# Patient Record
Sex: Male | Born: 1950 | Race: Black or African American | Hispanic: No | Marital: Married | Smoking: Never smoker
Health system: Southern US, Community
[De-identification: ages and names within clinical notes are randomized; demographics above are authoritative.]

## PROBLEM LIST (undated history)

## (undated) DIAGNOSIS — H04129 Dry eye syndrome of unspecified lacrimal gland: Secondary | ICD-10-CM

## (undated) DIAGNOSIS — D126 Benign neoplasm of colon, unspecified: Secondary | ICD-10-CM

## (undated) DIAGNOSIS — D709 Neutropenia, unspecified: Secondary | ICD-10-CM

## (undated) DIAGNOSIS — I10 Essential (primary) hypertension: Secondary | ICD-10-CM

## (undated) DIAGNOSIS — M479 Spondylosis, unspecified: Secondary | ICD-10-CM

## (undated) DIAGNOSIS — D72819 Decreased white blood cell count, unspecified: Secondary | ICD-10-CM

## (undated) DIAGNOSIS — A048 Other specified bacterial intestinal infections: Secondary | ICD-10-CM

## (undated) HISTORY — DX: Neutropenia, unspecified: D70.9

## (undated) HISTORY — DX: Spondylosis, unspecified: M47.9

## (undated) HISTORY — DX: Decreased white blood cell count, unspecified: D72.819

## (undated) HISTORY — PX: ANTERIOR CERVICAL DECOMP/DISCECTOMY FUSION: SHX1161

## (undated) HISTORY — PX: TONSILLECTOMY: SUR1361

## (undated) HISTORY — DX: Other specified bacterial intestinal infections: A04.8

## (undated) HISTORY — DX: Dry eye syndrome of unspecified lacrimal gland: H04.129

## (undated) HISTORY — DX: Essential (primary) hypertension: I10

## (undated) HISTORY — DX: Benign neoplasm of colon, unspecified: D12.6

---

## 1998-09-19 ENCOUNTER — Encounter: Payer: Self-pay | Admitting: Cardiovascular Disease

## 1998-09-19 ENCOUNTER — Ambulatory Visit (HOSPITAL_COMMUNITY): Admission: RE | Admit: 1998-09-19 | Discharge: 1998-09-19 | Payer: Self-pay | Admitting: Cardiovascular Disease

## 1998-11-11 ENCOUNTER — Emergency Department (HOSPITAL_COMMUNITY): Admission: EM | Admit: 1998-11-11 | Discharge: 1998-11-11 | Payer: Self-pay | Admitting: Emergency Medicine

## 1998-11-28 ENCOUNTER — Ambulatory Visit (HOSPITAL_COMMUNITY): Admission: RE | Admit: 1998-11-28 | Discharge: 1998-11-28 | Payer: Self-pay | Admitting: *Deleted

## 1999-01-13 ENCOUNTER — Emergency Department (HOSPITAL_COMMUNITY): Admission: EM | Admit: 1999-01-13 | Discharge: 1999-01-13 | Payer: Self-pay | Admitting: Emergency Medicine

## 1999-06-12 ENCOUNTER — Encounter: Payer: Self-pay | Admitting: Family Medicine

## 1999-06-12 ENCOUNTER — Ambulatory Visit (HOSPITAL_COMMUNITY): Admission: RE | Admit: 1999-06-12 | Discharge: 1999-06-12 | Payer: Self-pay | Admitting: Family Medicine

## 2001-12-28 ENCOUNTER — Encounter: Payer: Self-pay | Admitting: Family Medicine

## 2001-12-28 ENCOUNTER — Ambulatory Visit (HOSPITAL_COMMUNITY): Admission: RE | Admit: 2001-12-28 | Discharge: 2001-12-28 | Payer: Self-pay | Admitting: Family Medicine

## 2002-04-06 ENCOUNTER — Encounter: Payer: Self-pay | Admitting: Family Medicine

## 2002-04-06 ENCOUNTER — Ambulatory Visit (HOSPITAL_COMMUNITY): Admission: RE | Admit: 2002-04-06 | Discharge: 2002-04-06 | Payer: Self-pay | Admitting: Family Medicine

## 2002-08-31 ENCOUNTER — Encounter: Payer: Self-pay | Admitting: Emergency Medicine

## 2002-08-31 ENCOUNTER — Emergency Department (HOSPITAL_COMMUNITY): Admission: EM | Admit: 2002-08-31 | Discharge: 2002-08-31 | Payer: Self-pay | Admitting: Emergency Medicine

## 2002-12-16 ENCOUNTER — Ambulatory Visit (HOSPITAL_COMMUNITY): Admission: RE | Admit: 2002-12-16 | Discharge: 2002-12-16 | Payer: Self-pay | Admitting: Neurosurgery

## 2002-12-16 ENCOUNTER — Encounter: Payer: Self-pay | Admitting: Neurosurgery

## 2003-05-05 ENCOUNTER — Encounter: Admission: RE | Admit: 2003-05-05 | Discharge: 2003-05-05 | Payer: Self-pay | Admitting: Neurology

## 2003-05-05 ENCOUNTER — Encounter: Payer: Self-pay | Admitting: Radiology

## 2003-05-05 ENCOUNTER — Encounter: Payer: Self-pay | Admitting: Neurology

## 2003-07-18 ENCOUNTER — Encounter: Admission: RE | Admit: 2003-07-18 | Discharge: 2003-07-18 | Payer: Self-pay | Admitting: Neurology

## 2003-12-13 ENCOUNTER — Encounter: Admission: RE | Admit: 2003-12-13 | Discharge: 2003-12-13 | Payer: Self-pay | Admitting: Neurology

## 2004-09-29 ENCOUNTER — Encounter: Admission: RE | Admit: 2004-09-29 | Discharge: 2004-09-29 | Payer: Self-pay | Admitting: Neurosurgery

## 2004-12-10 ENCOUNTER — Ambulatory Visit (HOSPITAL_COMMUNITY): Admission: RE | Admit: 2004-12-10 | Discharge: 2004-12-11 | Payer: Self-pay | Admitting: Neurosurgery

## 2005-01-09 ENCOUNTER — Encounter: Admission: RE | Admit: 2005-01-09 | Discharge: 2005-01-09 | Payer: Self-pay | Admitting: Neurosurgery

## 2005-01-14 ENCOUNTER — Encounter: Admission: RE | Admit: 2005-01-14 | Discharge: 2005-01-14 | Payer: Self-pay | Admitting: Neurosurgery

## 2005-02-17 ENCOUNTER — Encounter: Admission: RE | Admit: 2005-02-17 | Discharge: 2005-02-17 | Payer: Self-pay | Admitting: Neurosurgery

## 2005-03-26 ENCOUNTER — Emergency Department (HOSPITAL_COMMUNITY): Admission: EM | Admit: 2005-03-26 | Discharge: 2005-03-26 | Payer: Self-pay | Admitting: Emergency Medicine

## 2005-03-29 ENCOUNTER — Emergency Department (HOSPITAL_COMMUNITY): Admission: EM | Admit: 2005-03-29 | Discharge: 2005-03-29 | Payer: Self-pay | Admitting: Family Medicine

## 2005-07-13 ENCOUNTER — Encounter: Admission: RE | Admit: 2005-07-13 | Discharge: 2005-07-13 | Payer: Self-pay | Admitting: Neurosurgery

## 2005-07-23 ENCOUNTER — Encounter: Admission: RE | Admit: 2005-07-23 | Discharge: 2005-07-23 | Payer: Self-pay | Admitting: Neurosurgery

## 2006-05-01 IMAGING — RF DG CERVICAL SPINE 1V
1 series · 1 of 1 positions shown · non-contrast
Comparison: none

CLINICAL DATA: Herniated C6-7 disk.
 CERVICAL SQOA5-6 VIEW:
 Lateral C-arm image of the cervical spine taken in the operating room.
 A single C-arm image demonstrates that the patient has undergone anterior cervical fusion at C6-7.  Plug and anterior plate and screws appear in good position with anatomic alignment at C6-7.

[Series 0: selected · 1 of 1 slices shown]
[im 1/1]
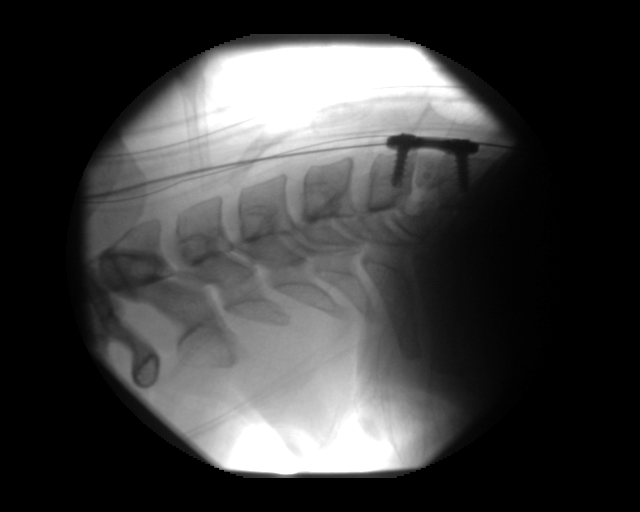

[1 of 1 positions shown; findings below may reference images not displayed]

IMPRESSION: Anterior cervical fusion at C6-7.

## 2006-07-07 ENCOUNTER — Encounter: Admission: RE | Admit: 2006-07-07 | Discharge: 2006-07-07 | Payer: Self-pay | Admitting: Family Medicine

## 2006-08-19 ENCOUNTER — Encounter: Admission: RE | Admit: 2006-08-19 | Discharge: 2006-08-19 | Payer: Self-pay | Admitting: Neurology

## 2006-12-12 IMAGING — CT CT CERVICAL SPINE W/ CM
2 of 6 series · 7 of 20 positions shown, 9 images · IV contrast (omnipaque)
Comparison: none

CLINICAL DATA: Patient with previous C6-7 fusion.   Neck and bilateral arm symptoms.
 CERVICAL MYELOGRAM:
TECHNIQUE: A lumbar puncture was performed from a left sided approach at the L2-3 interlaminar space using a 22 gauge spinal needle.  10 cc of Omnipaque 300 were instilled and directed into the cervical region by patient positioning.
TECHNIQUE: Multidetector CT imaging of the cervical spine was performed after intrathecal injection of contrast.  Multiplanar CT imaging reconstructions were also generated.

[Series 4: recon 3: prone c-spine · axial · 0.23mm/px · z∈[-198,-72]mm · 6 of 296 slices shown, 8 images]
[im 43/296  soft-tissue]
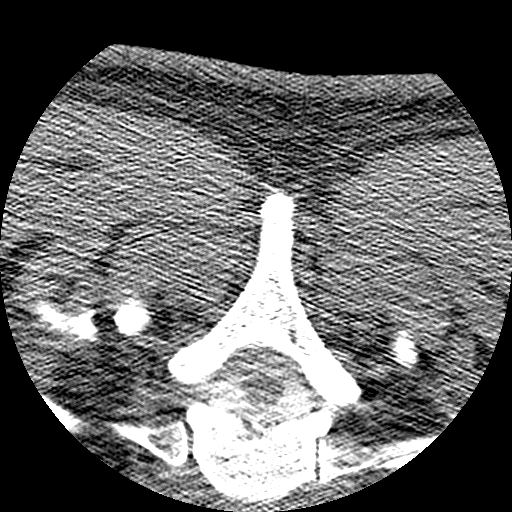
[im 43/296  bone]
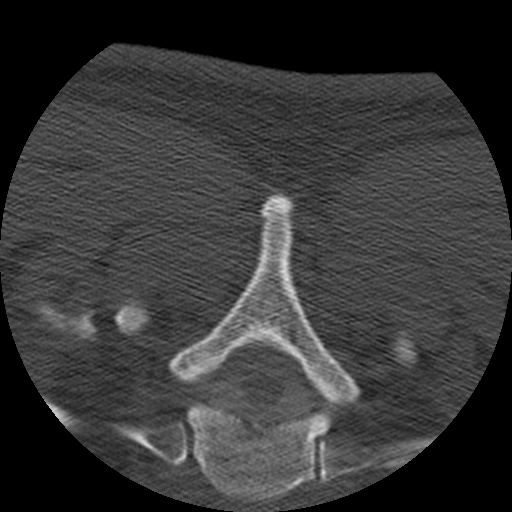
[im 85/296  bone]
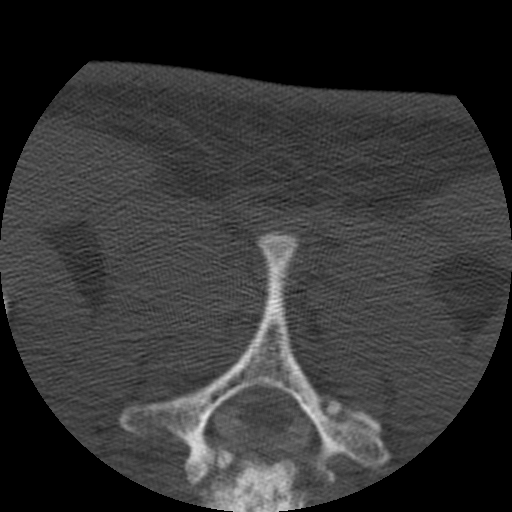
[im 127/296  bone]
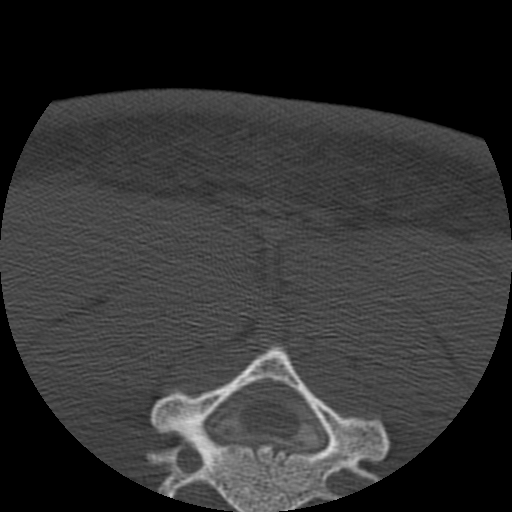
[im 169/296  bone]
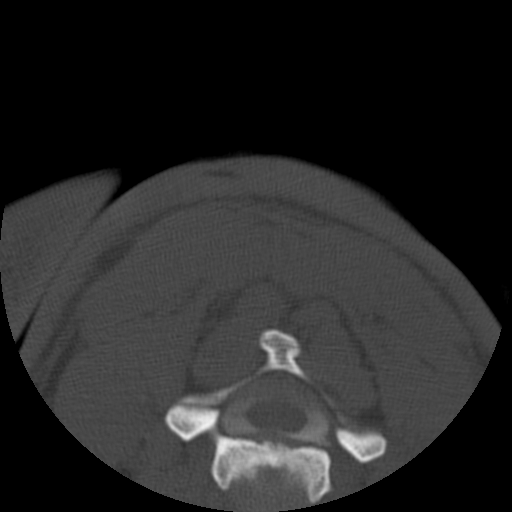
[im 211/296  soft-tissue]
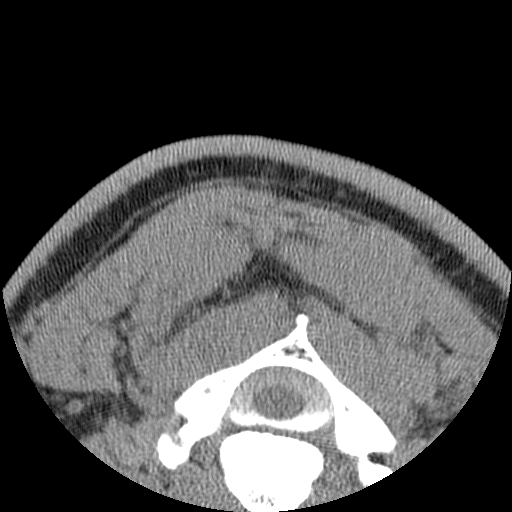
[im 211/296  bone]
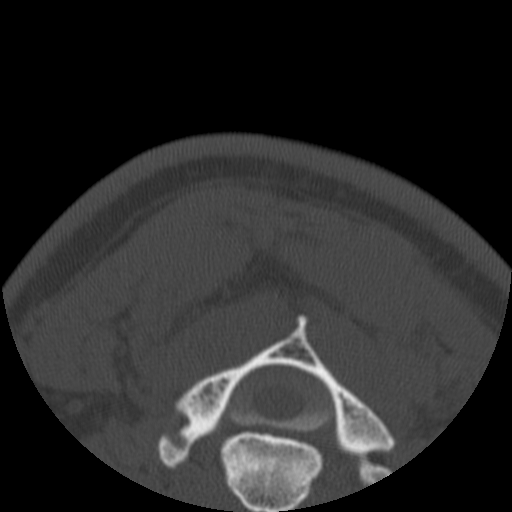
[im 253/296  bone]
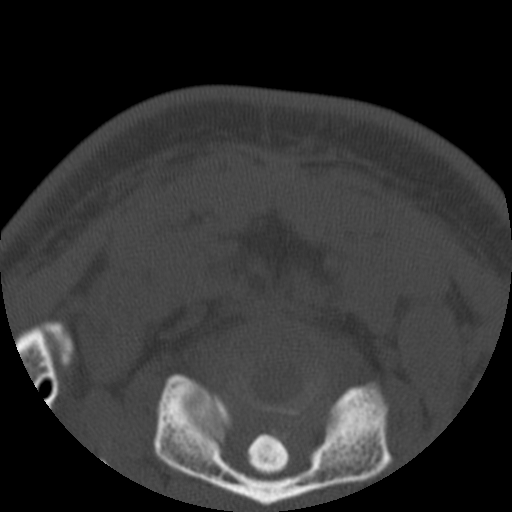

[Series 400: reformatted · sagittal · 0.36mm/px · 1 of 40 slices shown]
[im 20/40  bone]
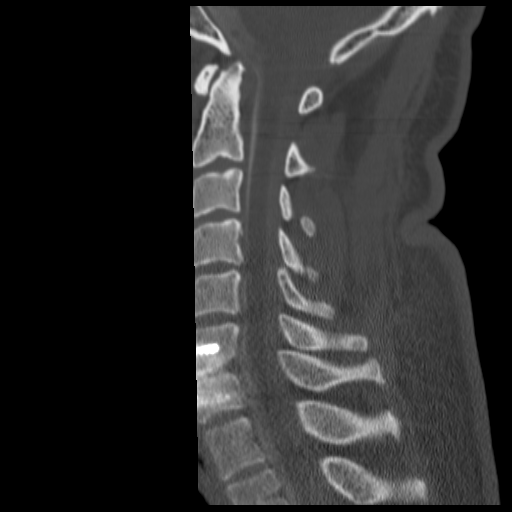

[7 of 20 positions shown; findings below may reference images not displayed]

FINDINGS: The spinal canal is widely patent.  There is no significant anterior extradural defect.  Ample subarachnoid space surrounds the cervical spinal cord.  No definite abnormality of root sleeve filling is demonstrated.  There has been anterior cervical diskectomy and fusion at C6-7 with anterior plate and screws.  No abnormality is seen relating to them on the myelographic images.
IMPRESSION: Normal post fusion appearance.  Wide patency of the canal and normal filling of the root sleeves.
 POST MYELOGRAM CT SCAN OF THE CERVICAL SPINE:
FINDINGS: The patient was scanned both prone and supine in order to get better contrast density ventrally.  
 No abnormality of the foramen magnum or C1-2.
 C2-3:  Normal interspace.
 C3-4:  Normal interspace. 
 C4-5:  Minimal spondylosis with small endplate osteophytes and minimal bulging of the disk.  Wide patency of the canal and foramina.  There are minor facet degenerative changes on the left at this level.
 C5-6:  There is mild spondylosis with endplate osteophytes and mild bulging of the disk.  Slightly more pronounced towards the left.  The central canal is widely patent.   There is mild encroachment upon the foramen on the left.  No facet pathology at this level.
 C6-7:  The patient has had previous anterior cervical diskectomy and fusion.  There is no lucency around the screws at either level.  There is apparently solid bony fusion between the interbody fusion plug and C6.  There is slight lucency along the margin between the plug and C7.  It is not clear to me that solid union has occurred along that interface.   The patient has mild uncovertebral hypertrophy on the left that encroaches upon the foramen mildly.
 C7-T1:  The disk is normal.  No facet pathology or foraminal encroachment.
IMPRESSION: 1.  Mild spondylosis at C4-5.  No significant encroachment upon the canal or foramina.  Mild facet arthropathy on the left. 
 2.  Mild spondylosis at C5-6.  Mild uncovertebral encroachment upon the foramen on the left.
 3.  Prior anterior cervical diskectomy and fusion at C6-7.  Wide patency of the canal.   Mild uncovertebral encroachment upon the foramen on the left.  No lucency around the screws.  Apparent solid fusion of the fusion plug to C6.  I see lucency along the interface of the fusion plug and C7 suggesting that there might not be solid fusion at this level.  Absence of lucency around the screws argues against any motion at this level, however.

## 2010-08-24 ENCOUNTER — Encounter: Payer: Self-pay | Admitting: Neurology

## 2010-08-25 ENCOUNTER — Encounter: Payer: Self-pay | Admitting: Neurology

## 2011-06-13 ENCOUNTER — Telehealth: Payer: Self-pay | Admitting: Hematology & Oncology

## 2011-06-13 NOTE — Telephone Encounter (Signed)
Pt aware of 1-27 appointment °

## 2011-07-11 ENCOUNTER — Other Ambulatory Visit: Payer: Self-pay

## 2011-07-11 ENCOUNTER — Ambulatory Visit (HOSPITAL_BASED_OUTPATIENT_CLINIC_OR_DEPARTMENT_OTHER): Payer: BC Managed Care – PPO | Admitting: Hematology & Oncology

## 2011-07-11 ENCOUNTER — Ambulatory Visit: Payer: Self-pay

## 2011-07-11 ENCOUNTER — Encounter: Payer: Self-pay | Admitting: Hematology & Oncology

## 2011-07-11 ENCOUNTER — Other Ambulatory Visit (HOSPITAL_BASED_OUTPATIENT_CLINIC_OR_DEPARTMENT_OTHER): Payer: BC Managed Care – PPO | Admitting: Lab

## 2011-07-11 VITALS — BP 122/75 | HR 64 | Temp 98.5°F | Ht 68.5 in | Wt 189.0 lb

## 2011-07-11 DIAGNOSIS — D72819 Decreased white blood cell count, unspecified: Secondary | ICD-10-CM

## 2011-07-11 DIAGNOSIS — D709 Neutropenia, unspecified: Secondary | ICD-10-CM

## 2011-07-11 HISTORY — DX: Decreased white blood cell count, unspecified: D72.819

## 2011-07-11 LAB — CBC WITH DIFFERENTIAL (CANCER CENTER ONLY)
BASO#: 0 10*3/uL (ref 0.0–0.2)
Eosinophils Absolute: 0.1 10*3/uL (ref 0.0–0.5)
LYMPH#: 2.7 10*3/uL (ref 0.9–3.3)
MCH: 30.5 pg (ref 28.0–33.4)
MONO#: 0.3 10*3/uL (ref 0.1–0.9)
MONO%: 6.3 % (ref 0.0–13.0)
NEUT%: 32.9 % — ABNORMAL LOW (ref 40.0–80.0)
RBC: 4.72 10*6/uL (ref 4.20–5.70)
WBC: 4.6 10*3/uL (ref 4.0–10.0)

## 2011-07-11 NOTE — Progress Notes (Signed)
This office note has been dictated.

## 2011-07-14 ENCOUNTER — Telehealth: Payer: Self-pay | Admitting: *Deleted

## 2011-07-14 LAB — ANA: Anti Nuclear Antibody(ANA): NEGATIVE

## 2011-07-14 NOTE — Telephone Encounter (Signed)
Mailed [phone] schedule

## 2011-07-14 NOTE — Progress Notes (Signed)
CC:   Theodore Gaines. Theodore Gaines, M.D. 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  He is originally from [place].  He has been in the [place] since 1980.  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  He is followed by Dr. Abigail Gaines.  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  He has seen Dr. Lelon Gaines.  I think he has had surgery for this.  He had a cervical decompression and fusion back in May 2006.  Dr. Abigail Gaines has noted him to have some neutropenia.  Going back to October of 2012, a CBC was done which showed a white cell count of 5.2, hemoglobin was 14.9, and platelet count 182.  MCV was 91.  White cell differential showed 27 segs, 63 lymphs, and 8 monocytes.  Dr. Abigail Gaines repeated the lab work on the 19th of October.  This showed a white cell count of 4.7.  His white cell differential showed 27 segs and 66 lymphs.  He had a normal platelet count.  He felt that he need to be seen by Hematology.  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  He has not had any problem with infections.  He has had no rashes.  There has been no weight loss or weight gain.  He is not a vegetarian.  Other lab work that he had done in October showed a PSA of 0.85.  His TSH was 2.36.  He has not had any change in bowel or bladder habits.  He does take acupuncture and massage therapy for his neck.  PAST MEDICAL HISTORY:  Remarkable for: 1. Hypertension. 2. Degenerative disk disease of the neck, status post neck fusion. 3. History of Helicobacter pylori.  ALLERGIES:  IODINE.  MEDICATIONS: 1. Atenolol 50 mg p.o. daily. 2. Aspirin 81 mg p.o. daily. 3. Prednisolone eyedrops daily. 4. Ibuprofen 800 mg p.o. q.8 hours p.r.n.  SOCIAL HISTORY:  Negative for tobacco use.  There is really no alcohol use.  He has no obvious occupational exposures.  FAMILY  HISTORY:  Noncontributory.  His father died of sepsis back in [place].  There is no obvious hematologic issues as far as Dr. Al Gaines notes.  REVIEW OF SYSTEMS:  As stated in the history present illness.  No additional findings are noted on a 12-system review.  PHYSICAL EXAMINATION:  General Appearance:  This is a well-developed, well-nourished, African gentleman in no obvious distress.  Vital Signs: 98.5, pulse 64, heart rate 18, blood pressure 122/75.  Weight is 189. Head and Neck Exam:  A normocephalic, atraumatic skull.  He has no ocular or oral lesions.  There are no palpable cervical or supraclavicular lymph nodes.  Thyroid is nonpalpable.  Lungs:  Clear to percussion and auscultation bilaterally.  Cardiac Exam:  Regular rate and rhythm with normal S1, S2.  There are no murmurs, rubs, or bruits. Abdominal Exam:  Soft with good bowel sounds.  There is no palpable abdominal mass.  There is no fluid wave.  There is no palpable hepatosplenomegaly.  Back Exam:  No tenderness over the spine, ribs, or hips.  Extremities:  No clubbing, cyanosis, or edema.  He has good range motion of his joints.  There is no erythema, warmth, or swelling of his joints.  Neurological Exam:  No focal neurological deficits.  Skin Exam: No rashes, ecchymosis, or petechia.  LABORATORY STUDIES:  White cell count 4.6, hemoglobin 14.4, hematocrit 40, platelet count 179.  White cell differential shows 33 segs, 59 lymphs, 6 monos.  His peripheral smear shows a normochromic, normocytic population of red blood cells.  There are no nucleated red blood cells. I see no teardrop cells.  He has no target cells.  The white cells appear normal in morphology.  He may have a slight increase in lymphocytes.  I do not see any atypical lymphocytes.  He may have a couple of large lymphocytes.  I do not see any immature myeloid or lymphoid cells.  He has no hypersegmented polys.  The platelets are adequate in number and  size.  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  He has asymptomatic neutropenia.  This, in my mind, is more related to his ancestry than anything else.  We see leukopenia in about 25-30% of African Americans.  This is based on "genetic selection."  It was felt to be protective back many years ago against malaria.  I do not see anything on Dr. Sharlette Gaines physical that would raise suspicion.  He has no lymphadenopathy.  He has had no splenomegaly.  I do not see a need for a bone marrow biopsy right now.  I do not see a need for any additional testing.  There is no indication for any x-ray tests.  I have reviewed Dr. Sharlette Gaines labs with him.  He does have a good understanding of the situation.  I alleviated some of his concerns.  I spent a good hour or so with him today.  I would like to see him back in another 3 or 4 months for followup.  I think if his lab work looks okay when we see him back, then we should be able to discharge him from the clinic.    ______________________________ Josph Macho, M.D. PRE/MEDQ  D:  07/11/2011  T:  07/11/2011  Job:  [id number]  ADDENDUM: (-)ANA and RF.

## 2011-11-07 ENCOUNTER — Encounter: Payer: Self-pay | Admitting: Hematology & Oncology

## 2011-11-07 ENCOUNTER — Ambulatory Visit (HOSPITAL_BASED_OUTPATIENT_CLINIC_OR_DEPARTMENT_OTHER): Payer: BC Managed Care – PPO | Admitting: Hematology & Oncology

## 2011-11-07 ENCOUNTER — Other Ambulatory Visit (HOSPITAL_BASED_OUTPATIENT_CLINIC_OR_DEPARTMENT_OTHER): Payer: BC Managed Care – PPO | Admitting: Lab

## 2011-11-07 VITALS — BP 131/77 | HR 73 | Temp 97.0°F | Ht 68.0 in | Wt 190.0 lb

## 2011-11-07 DIAGNOSIS — D72819 Decreased white blood cell count, unspecified: Secondary | ICD-10-CM

## 2011-11-07 DIAGNOSIS — D709 Neutropenia, unspecified: Secondary | ICD-10-CM

## 2011-11-07 LAB — CBC WITH DIFFERENTIAL (CANCER CENTER ONLY)
BASO#: 0 10*3/uL (ref 0.0–0.2)
BASO%: 0.2 % (ref 0.0–2.0)
Eosinophils Absolute: 0 10*3/uL (ref 0.0–0.5)
HCT: 40.5 % (ref 38.7–49.9)
HGB: 14.5 g/dL (ref 13.0–17.1)
LYMPH#: 3.1 10*3/uL (ref 0.9–3.3)
LYMPH%: 62.3 % — ABNORMAL HIGH (ref 14.0–48.0)
MCHC: 35.8 g/dL (ref 32.0–35.9)
MONO%: 7.4 % (ref 0.0–13.0)
NEUT#: 1.5 10*3/uL (ref 1.5–6.5)
NEUT%: 29.5 % — ABNORMAL LOW (ref 40.0–80.0)
RBC: 4.77 10*6/uL (ref 4.20–5.70)
WBC: 5 10*3/uL (ref 4.0–10.0)

## 2011-11-07 NOTE — Progress Notes (Signed)
This office note has been dictated.

## 2011-11-08 NOTE — Progress Notes (Signed)
DIAGNOSIS:  Leukopenia, chronic benign.  CURRENT THERAPY:  Observation.  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  We initially saw him back in I think December.  At that point in time, all of his lab work came back as normal.  He had a negative ANA and rheumatoid factor.  He is doing well.  He is getting ready go to [place] for a visit in a couple of weeks or so.  He is still not working.  Hopefully, he will be able to get back into work.  I think he used to work over at [employer].  He has had no problem with infections.  He has had no fever.  He has had no change in bowel or bladder habits.  PHYSICAL EXAMINATION:  This is a well-developed, well-nourished African American gentleman in no obvious distress.  Vital signs:  97, pulse 73, respiratory rate 18, blood pressure 131/77.  Weight is 190.  Head and neck:  Shows a normocephalic, atraumatic skull.  There are no ocular or oral lesions.  There are no palpable cervical or supraclavicular lymph nodes.  Lungs:  Clear bilaterally.  Cardiac:  Regular rate and rhythm with a normal S1 and S2.  There are no murmurs or rubs, or bruits. Abdomen:  Soft with good bowel sounds.  There is no palpable abdominal mass.  There is no fluid wave.  There is no palpable hepatosplenomegaly. Back:  No tenderness over the spine, ribs, or hips.  Extremities:  Show no clubbing, cyanosis or edema.  Neurologic:  Shows no focal neurological deficits.  LABORATORY STUDIES:  White cell count of 5, hemoglobin 14.5, hematocrit 40.5, platelet count 181.  White cell differential shows 30 segs, 62 lymphocytes.  Peripheral smear shows good maturation of his white blood cells.  He does have an increase in lymphocytes.  These lymphocytes appear mature. There are no hypersegmented polys.  I see no immature myeloid cells. Red cells appear normochromic and normocytic.  There are no nucleated red blood cells.  He has no teardrop cells.   There are no schistocytes. Platelets are adequate in number and size.  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 He has asymptomatic leukopenia.  Again, this is reflective of his African ancestry.  His blood smear is unremarkable.  It is very common for [place] to see a reversed neutrophil/lymphocyte ratio in Africans with some neutropenia.  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 I do not see that he needs a bone marrow test.  I just believe that we can see him back as needed.  There are no precautions that need to be taken for him.    ______________________________ Josph Macho, M.D. PRE/MEDQ  D:  11/07/2011  T:  11/07/2011  Job:  [id number]

## 2012-01-08 ENCOUNTER — Ambulatory Visit
Admission: RE | Admit: 2012-01-08 | Discharge: 2012-01-08 | Disposition: A | Discharge status: Home / Self Care | Payer: BC Managed Care – PPO | Source: Ambulatory Visit | Attending: Family Medicine | Admitting: Family Medicine

## 2012-01-08 ENCOUNTER — Other Ambulatory Visit: Payer: Self-pay | Admitting: Family Medicine

## 2012-01-08 DIAGNOSIS — R609 Edema, unspecified: Secondary | ICD-10-CM

## 2013-09-03 ENCOUNTER — Encounter: Payer: Self-pay | Admitting: Cardiology

## 2013-09-03 DIAGNOSIS — I10 Essential (primary) hypertension: Secondary | ICD-10-CM | POA: Insufficient documentation

## 2013-09-14 ENCOUNTER — Other Ambulatory Visit: Payer: Self-pay | Admitting: Family Medicine

## 2013-09-14 DIAGNOSIS — R51 Headache: Secondary | ICD-10-CM

## 2013-09-16 ENCOUNTER — Ambulatory Visit
Admission: RE | Admit: 2013-09-16 | Discharge: 2013-09-16 | Disposition: A | Discharge status: Home / Self Care | Payer: BC Managed Care – PPO | Source: Ambulatory Visit | Attending: Family Medicine | Admitting: Family Medicine

## 2013-09-16 DIAGNOSIS — R51 Headache: Secondary | ICD-10-CM

## 2013-09-16 MED ORDER — IOHEXOL 300 MG/ML  SOLN
75.0000 mL | Freq: Once | INTRAMUSCULAR | Status: AC | PRN
  Administered 2013-09-16: 75 mL via INTRAVENOUS

## 2014-06-13 ENCOUNTER — Other Ambulatory Visit: Payer: Self-pay | Admitting: Family Medicine

## 2014-06-13 ENCOUNTER — Ambulatory Visit
Admission: RE | Admit: 2014-06-13 | Discharge: 2014-06-13 | Disposition: A | Discharge status: Home / Self Care | Payer: BC Managed Care – PPO | Source: Ambulatory Visit | Attending: Family Medicine | Admitting: Family Medicine

## 2014-06-13 DIAGNOSIS — R059 Cough, unspecified: Secondary | ICD-10-CM

## 2014-06-13 DIAGNOSIS — R05 Cough: Secondary | ICD-10-CM

## 2014-07-11 ENCOUNTER — Other Ambulatory Visit: Payer: Self-pay

## 2014-10-22 ENCOUNTER — Other Ambulatory Visit (HOSPITAL_COMMUNITY)
Admission: RE | Admit: 2014-10-22 | Discharge: 2014-10-22 | Disposition: A | Discharge status: Home / Self Care | Payer: BC Managed Care – PPO | Source: Ambulatory Visit | Attending: Family Medicine | Admitting: Family Medicine

## 2014-10-22 ENCOUNTER — Encounter (HOSPITAL_COMMUNITY): Payer: Self-pay | Admitting: Emergency Medicine

## 2014-10-22 ENCOUNTER — Emergency Department (HOSPITAL_COMMUNITY)
Admission: EM | Admit: 2014-10-22 | Discharge: 2014-10-22 | Disposition: A | Discharge status: Home / Self Care | Payer: BC Managed Care – PPO | Source: Home / Self Care | Attending: Family Medicine | Admitting: Family Medicine

## 2014-10-22 DIAGNOSIS — M545 Low back pain, unspecified: Secondary | ICD-10-CM

## 2014-10-22 DIAGNOSIS — Z113 Encounter for screening for infections with a predominantly sexual mode of transmission: Secondary | ICD-10-CM | POA: Diagnosis not present

## 2014-10-22 DIAGNOSIS — R1032 Left lower quadrant pain: Secondary | ICD-10-CM

## 2014-10-22 DIAGNOSIS — R319 Hematuria, unspecified: Secondary | ICD-10-CM

## 2014-10-22 LAB — POCT I-STAT, CHEM 8
BUN: 13 mg/dL (ref 6–23)
CHLORIDE: 103 mmol/L (ref 96–112)
Calcium, Ion: 1.28 mmol/L (ref 1.13–1.30)
Creatinine, Ser: 1 mg/dL (ref 0.50–1.35)
Glucose, Bld: 92 mg/dL (ref 70–99)
HCT: 47 % (ref 39.0–52.0)
Hemoglobin: 16 g/dL (ref 13.0–17.0)
POTASSIUM: 3.9 mmol/L (ref 3.5–5.1)
Sodium: 142 mmol/L (ref 135–145)
TCO2: 24 mmol/L (ref 0–100)

## 2014-10-22 LAB — POCT URINALYSIS DIP (DEVICE)
BILIRUBIN URINE: NEGATIVE
GLUCOSE, UA: NEGATIVE mg/dL
KETONES UR: NEGATIVE mg/dL
Leukocytes, UA: NEGATIVE
NITRITE: NEGATIVE
Protein, ur: NEGATIVE mg/dL
Specific Gravity, Urine: 1.015 (ref 1.005–1.030)
Urobilinogen, UA: 0.2 mg/dL (ref 0.0–1.0)
pH: 6.5 (ref 5.0–8.0)

## 2014-10-22 MED ORDER — HYDROCODONE-ACETAMINOPHEN 5-325 MG PO TABS: 1.0000 | ORAL_TABLET | Freq: Four times a day (QID) | ORAL | Status: AC | PRN

## 2014-10-22 MED ORDER — IBUPROFEN 800 MG PO TABS: 800.0000 mg | ORAL_TABLET | Freq: Three times a day (TID) | ORAL | Status: AC

## 2014-10-22 MED ORDER — TAMSULOSIN HCL 0.4 MG PO CAPS: 0.4000 mg | ORAL_CAPSULE | Freq: Every day | ORAL | Status: AC

## 2014-10-22 NOTE — ED Provider Notes (Signed)
CSN: [id number]     Arrival date & time 10/22/14  1350 History   First MD Initiated Contact with Patient 10/22/14 1520     No chief complaint on file.  (Consider location/radiation/quality/duration/timing/severity/associated sxs/prior Treatment) HPI        64 year old male presents complaining of back pain radiating into his left lower quadrant of his abdomen and into left testicle. This started acutely this morning and woke him up from his sleep. The pain was 10 out of 10, waxing and waning. He noted blood in his urine as well. Now the pain is gotten slightly better but he is still having some pain. He denies dysuria, frequency, urgency, or penile discharge. He had a similar problem approximately 11 years ago which was determined to be gas pains by an emergency department in [place]. He has had nausea but no vomiting. No fever.  Past Medical History  Diagnosis Date  . Leukopenia 07/11/2011  . HTN (hypertension)   . Spondylosis     , neck  . H. pylori infection   . Dry eye   . Neutropenia     , Borderline  . Adenomatous colon polyp    Past Surgical History  Procedure Laterality Date  . Tonsillectomy    . Anterior cervical decomp/discectomy fusion     Family History  Problem Relation Age of Onset  . Other Mother     Back Pain  . Asthma Mother   . Other Father     Septicemia  . Diabetes Sister   . Diabetes Brother   . Diabetes Sister     A & W  . Diabetes Sister     A & W/A & W  . Diabetes Brother     A & W  . Diabetes Brother     A & W   History  Substance Use Topics  . Smoking status: Never Smoker   . Smokeless tobacco: Not on file  . Alcohol Use: Yes     Comment: once a week.    Review of Systems  Gastrointestinal: Positive for nausea and abdominal pain. Negative for vomiting and diarrhea.  Genitourinary: Positive for hematuria and testicular pain.  Musculoskeletal: Positive for back pain.  All other systems reviewed and are negative.   Allergies  Contrast  media; Fruit blend; and Skelaxin  Home Medications   Prior to Admission medications   Medication Sig Start Date End Date Taking? Authorizing Provider  Bismuth Subsalicylate (PEPTO-BISMOL PO) Take by mouth.   Yes Historical Provider, MD  calcium carbonate (TUMS - DOSED IN MG ELEMENTAL CALCIUM) 500 MG chewable tablet Chew 1 tablet by mouth daily.   Yes Historical Provider, MD  Simethicone (GAS-X PO) Take by mouth.   Yes Historical Provider, MD  aspirin 81 MG tablet Take 81 mg by mouth daily.      Historical Provider, MD  atenolol (TENORMIN) 50 MG tablet Take 50 mg by mouth daily.      Historical Provider, MD  fish oil-omega-3 fatty acids 1000 MG capsule Take 1,200 g by mouth daily.     Historical Provider, MD  HYDROcodone-acetaminophen (NORCO) 5-325 MG per tablet Take 1 tablet by mouth every 6 (six) hours as needed for severe pain. 10/22/14   Liam Graham, PA-C  ibuprofen (ADVIL,MOTRIN) 200 MG tablet Take 200 mg by mouth every 6 (six) hours as needed.      Historical Provider, MD  ibuprofen (ADVIL,MOTRIN) 800 MG tablet Take 1 tablet (800 mg total) by mouth 3 (three)  times daily. 10/22/14   Freeman Caldron Tammala Weider, PA-C  losartan (COZAAR) 50 MG tablet Take 50 mg by mouth daily.    Historical Provider, MD  Multiple Vitamin (MULTI VITAMIN MENS) tablet Take 1 tablet by mouth daily.    Historical Provider, MD  Polyethyl Glycol-Propyl Glycol (SYSTANE) 0.4-0.3 % SOLN Apply 1 drop to eye as needed.    Historical Provider, MD  tamsulosin (FLOMAX) 0.4 MG CAPS capsule Take 1 capsule (0.4 mg total) by mouth daily. 10/22/14   Freeman Caldron Alayia Meggison, PA-C   BP 151/95 mmHg  Pulse 63  Temp(Src) 98 F (36.7 C) (Oral)  Resp 16  SpO2 99% Physical Exam  Constitutional: He is oriented to person, place, and time. He appears well-developed and well-nourished. No distress.  HENT:  Head: Normocephalic and atraumatic.  Left Ear: External ear normal.  Eyes: Conjunctivae are normal.  Neck: Normal range of motion. Neck supple.   Cardiovascular: Normal rate, regular rhythm and normal heart sounds.   Pulmonary/Chest: Effort normal and breath sounds normal. No respiratory distress.  Abdominal: Soft. He exhibits no mass. There is tenderness (minimal left lower quadrant tenderness). There is CVA tenderness (On the left). There is no rebound and no guarding.  Lymphadenopathy:    He has no cervical adenopathy.  Neurological: He is alert and oriented to person, place, and time. Coordination normal.  Skin: Skin is warm and dry. No rash noted. He is not diaphoretic.  Psychiatric: He has a normal mood and affect. Judgment normal.  Nursing note and vitals reviewed.   ED Course  Procedures (including critical care time) Labs Review Labs Reviewed  POCT URINALYSIS DIP (DEVICE) - Abnormal; Notable for the following:    Hgb urine dipstick LARGE (*)    All other components within normal limits  URINE CULTURE  POCT I-STAT, CHEM 8  URINE CYTOLOGY ANCILLARY ONLY    Imaging Review No results found.   MDM   1. Left-sided low back pain without sciatica   2. LLQ abdominal pain   3. Hematuria    Symptoms are most consistent with nephrolithiasis. It is likely that this is what he had before, however it seems that he had a contrast CT which would probably miss a kidney stone. His vital signs are normal. No signs of renal dysfunction on his i-STAT today. Pain is slightly better than it was earlier, is possible that he has our he passed a stone. I do not suspect any more emergent condition at this time. Treat with NSAIDs, Flomax, and a few tablets of Norco for more severe pain. Strain all urine. Follow-up with urology, call for an appointment tomorrow. ED if worsening before he can be seen by urology. Urine culture sent at the patient's request, also cytology sent  New Prescriptions   HYDROCODONE-ACETAMINOPHEN (NORCO) 5-325 MG PER TABLET    Take 1 tablet by mouth every 6 (six) hours as needed for severe pain.   IBUPROFEN  (ADVIL,MOTRIN) 800 MG TABLET    Take 1 tablet (800 mg total) by mouth 3 (three) times daily.   TAMSULOSIN (FLOMAX) 0.4 MG CAPS CAPSULE    Take 1 capsule (0.4 mg total) by mouth daily.       Liam Graham, PA-C 10/22/14 [phone]

## 2014-10-23 LAB — URINE CULTURE
COLONY COUNT: NO GROWTH
CULTURE: NO GROWTH

## 2014-10-23 LAB — URINE CYTOLOGY ANCILLARY ONLY
CHLAMYDIA, DNA PROBE: NEGATIVE
NEISSERIA GONORRHEA: NEGATIVE
TRICH (WINDOWPATH): NEGATIVE

## 2014-11-14 ENCOUNTER — Other Ambulatory Visit: Payer: Self-pay | Admitting: Family Medicine

## 2014-11-14 DIAGNOSIS — R109 Unspecified abdominal pain: Secondary | ICD-10-CM

## 2014-11-17 ENCOUNTER — Ambulatory Visit
Admission: RE | Admit: 2014-11-17 | Discharge: 2014-11-17 | Disposition: A | Discharge status: Home / Self Care | Payer: BC Managed Care – PPO | Source: Ambulatory Visit | Attending: Family Medicine | Admitting: Family Medicine

## 2014-11-17 DIAGNOSIS — R109 Unspecified abdominal pain: Secondary | ICD-10-CM
